# Patient Record
Sex: Female | Born: 1975 | Race: White | Hispanic: No | Smoking: Former smoker
Health system: Southern US, Community
[De-identification: ages and names within clinical notes are randomized; demographics above are authoritative.]

## PROBLEM LIST (undated history)

## (undated) DIAGNOSIS — J45909 Unspecified asthma, uncomplicated: Secondary | ICD-10-CM

## (undated) DIAGNOSIS — K589 Irritable bowel syndrome without diarrhea: Secondary | ICD-10-CM

## (undated) DIAGNOSIS — K219 Gastro-esophageal reflux disease without esophagitis: Secondary | ICD-10-CM

## (undated) DIAGNOSIS — M810 Age-related osteoporosis without current pathological fracture: Secondary | ICD-10-CM

## (undated) DIAGNOSIS — J449 Chronic obstructive pulmonary disease, unspecified: Secondary | ICD-10-CM

## (undated) DIAGNOSIS — Z148 Genetic carrier of other disease: Secondary | ICD-10-CM

## (undated) DIAGNOSIS — M419 Scoliosis, unspecified: Secondary | ICD-10-CM

## (undated) DIAGNOSIS — M1990 Unspecified osteoarthritis, unspecified site: Secondary | ICD-10-CM

## (undated) HISTORY — DX: Chronic obstructive pulmonary disease, unspecified: J44.9

## (undated) HISTORY — DX: Gastro-esophageal reflux disease without esophagitis: K21.9

## (undated) HISTORY — DX: Genetic carrier of other disease: Z14.8

## (undated) HISTORY — DX: Unspecified osteoarthritis, unspecified site: M19.90

## (undated) HISTORY — DX: Scoliosis, unspecified: M41.9

## (undated) HISTORY — DX: Unspecified asthma, uncomplicated: J45.909

## (undated) HISTORY — DX: Age-related osteoporosis without current pathological fracture: M81.0

## (undated) HISTORY — DX: Irritable bowel syndrome, unspecified: K58.9

## (undated) HISTORY — PX: OTHER SURGICAL HISTORY: SHX169

---

## 2006-01-30 ENCOUNTER — Ambulatory Visit: Payer: Self-pay | Admitting: Family Medicine

## 2006-11-07 ENCOUNTER — Ambulatory Visit (HOSPITAL_COMMUNITY): Payer: Self-pay | Admitting: Pulmonary Disease

## 2006-11-07 ENCOUNTER — Encounter (HOSPITAL_COMMUNITY): Admission: RE | Admit: 2006-11-07 | Discharge: 2006-12-07 | Payer: Self-pay | Admitting: Pulmonary Disease

## 2006-12-12 ENCOUNTER — Encounter (HOSPITAL_COMMUNITY): Admission: RE | Admit: 2006-12-12 | Discharge: 2007-01-11 | Payer: Self-pay | Admitting: Pulmonary Disease

## 2007-03-12 ENCOUNTER — Ambulatory Visit (HOSPITAL_COMMUNITY): Admission: RE | Admit: 2007-03-12 | Discharge: 2007-03-12 | Payer: Self-pay | Admitting: Pulmonary Disease

## 2007-10-09 ENCOUNTER — Ambulatory Visit (HOSPITAL_COMMUNITY): Admission: RE | Admit: 2007-10-09 | Discharge: 2007-10-09 | Payer: Self-pay | Admitting: Pulmonary Disease

## 2008-04-03 ENCOUNTER — Ambulatory Visit (HOSPITAL_COMMUNITY): Admission: RE | Admit: 2008-04-03 | Discharge: 2008-04-03 | Payer: Self-pay | Admitting: Pulmonary Disease

## 2008-04-28 ENCOUNTER — Ambulatory Visit (HOSPITAL_COMMUNITY): Admission: RE | Admit: 2008-04-28 | Discharge: 2008-04-28 | Payer: Self-pay | Admitting: Pulmonary Disease

## 2008-07-10 ENCOUNTER — Ambulatory Visit (HOSPITAL_COMMUNITY): Admission: RE | Admit: 2008-07-10 | Discharge: 2008-07-10 | Payer: Self-pay | Admitting: Pulmonary Disease

## 2008-10-16 ENCOUNTER — Ambulatory Visit (HOSPITAL_COMMUNITY): Admission: RE | Admit: 2008-10-16 | Discharge: 2008-10-16 | Payer: Self-pay | Admitting: Pulmonary Disease

## 2009-03-29 ENCOUNTER — Ambulatory Visit (HOSPITAL_COMMUNITY): Admission: RE | Admit: 2009-03-29 | Discharge: 2009-03-29 | Payer: Self-pay | Admitting: Pulmonary Disease

## 2009-08-09 ENCOUNTER — Ambulatory Visit (HOSPITAL_COMMUNITY): Admission: RE | Admit: 2009-08-09 | Discharge: 2009-08-09 | Payer: Self-pay | Admitting: Pulmonary Disease

## 2009-12-16 ENCOUNTER — Encounter: Payer: Self-pay | Admitting: Gastroenterology

## 2009-12-16 ENCOUNTER — Ambulatory Visit: Payer: Self-pay | Admitting: Internal Medicine

## 2009-12-16 DIAGNOSIS — R634 Abnormal weight loss: Secondary | ICD-10-CM

## 2009-12-16 DIAGNOSIS — D539 Nutritional anemia, unspecified: Secondary | ICD-10-CM | POA: Insufficient documentation

## 2009-12-16 DIAGNOSIS — E8801 Alpha-1-antitrypsin deficiency: Secondary | ICD-10-CM

## 2009-12-16 DIAGNOSIS — R197 Diarrhea, unspecified: Secondary | ICD-10-CM

## 2009-12-17 ENCOUNTER — Encounter: Payer: Self-pay | Admitting: Internal Medicine

## 2009-12-17 DIAGNOSIS — R945 Abnormal results of liver function studies: Secondary | ICD-10-CM | POA: Insufficient documentation

## 2009-12-17 LAB — CONVERTED CEMR LAB
Albumin: 4.8 g/dL (ref 3.5–5.2)
Alkaline Phosphatase: 144 units/L — ABNORMAL HIGH (ref 39–117)
Basophils Absolute: 0 10*3/uL (ref 0.0–0.1)
Basophils Relative: 0 % (ref 0–1)
Eosinophils Absolute: 0.1 10*3/uL (ref 0.0–0.7)
HCT: 35.7 % — ABNORMAL LOW (ref 36.0–46.0)
Indirect Bilirubin: 0.9 mg/dL (ref 0.0–0.9)
Lymphs Abs: 1.3 10*3/uL (ref 0.7–4.0)
MCV: 104.4 fL — ABNORMAL HIGH (ref 78.0–100.0)
RBC: 3.42 M/uL — ABNORMAL LOW (ref 3.87–5.11)
RDW: 12.5 % (ref 11.5–15.5)
Tissue Transglutaminase Ab, IgA: 0.8 units (ref <7)
Total Bilirubin: 1.1 mg/dL (ref 0.3–1.2)
WBC: 3.5 10*3/uL — ABNORMAL LOW (ref 4.0–10.5)

## 2009-12-18 ENCOUNTER — Encounter: Payer: Self-pay | Admitting: Gastroenterology

## 2009-12-20 ENCOUNTER — Telehealth: Payer: Self-pay | Admitting: Internal Medicine

## 2009-12-23 ENCOUNTER — Encounter: Payer: Self-pay | Admitting: Internal Medicine

## 2009-12-24 LAB — CONVERTED CEMR LAB
Ferritin: 293 ng/mL — ABNORMAL HIGH (ref 10–291)
HCV Ab: NEGATIVE
Hepatitis B Surface Ag: NEGATIVE
Iron: 142 ug/dL (ref 42–145)
TIBC: 353 ug/dL (ref 250–470)
UIBC: 211 ug/dL

## 2009-12-27 ENCOUNTER — Ambulatory Visit: Payer: Self-pay | Admitting: Internal Medicine

## 2009-12-27 ENCOUNTER — Ambulatory Visit (HOSPITAL_COMMUNITY): Admission: RE | Admit: 2009-12-27 | Discharge: 2009-12-27 | Payer: Self-pay | Admitting: Internal Medicine

## 2010-01-03 ENCOUNTER — Encounter (INDEPENDENT_AMBULATORY_CARE_PROVIDER_SITE_OTHER): Payer: Self-pay

## 2010-01-27 ENCOUNTER — Encounter (INDEPENDENT_AMBULATORY_CARE_PROVIDER_SITE_OTHER): Payer: Self-pay | Admitting: *Deleted

## 2010-01-27 ENCOUNTER — Ambulatory Visit (HOSPITAL_COMMUNITY): Admission: RE | Admit: 2010-01-27 | Discharge: 2010-01-27 | Payer: Self-pay | Admitting: Pulmonary Disease

## 2010-02-28 ENCOUNTER — Encounter (INDEPENDENT_AMBULATORY_CARE_PROVIDER_SITE_OTHER): Payer: Self-pay

## 2010-03-05 ENCOUNTER — Encounter: Payer: Self-pay | Admitting: Gastroenterology

## 2010-03-09 ENCOUNTER — Encounter: Payer: Self-pay | Admitting: Gastroenterology

## 2010-03-09 LAB — CONVERTED CEMR LAB
ALT: 18 units/L (ref 0–35)
Albumin: 4.7 g/dL (ref 3.5–5.2)
Bilirubin, Direct: 0.1 mg/dL (ref 0.0–0.3)

## 2010-04-26 ENCOUNTER — Telehealth (INDEPENDENT_AMBULATORY_CARE_PROVIDER_SITE_OTHER): Payer: Self-pay

## 2010-04-29 ENCOUNTER — Encounter (INDEPENDENT_AMBULATORY_CARE_PROVIDER_SITE_OTHER): Payer: Self-pay

## 2010-05-11 ENCOUNTER — Telehealth: Payer: Self-pay | Admitting: Gastroenterology

## 2010-06-28 ENCOUNTER — Encounter (INDEPENDENT_AMBULATORY_CARE_PROVIDER_SITE_OTHER): Payer: Self-pay | Admitting: *Deleted

## 2010-06-28 LAB — CONVERTED CEMR LAB
AST: 13 units/L (ref 0–37)
Albumin: 4.4 g/dL (ref 3.5–5.2)
Alkaline Phosphatase: 100 units/L (ref 39–117)
Bilirubin, Direct: 0.1 mg/dL (ref 0.0–0.3)

## 2010-07-12 ENCOUNTER — Ambulatory Visit: Payer: Self-pay | Admitting: Internal Medicine

## 2010-07-13 ENCOUNTER — Encounter: Payer: Self-pay | Admitting: Internal Medicine

## 2010-07-15 ENCOUNTER — Encounter (INDEPENDENT_AMBULATORY_CARE_PROVIDER_SITE_OTHER): Payer: Self-pay

## 2010-07-15 LAB — CONVERTED CEMR LAB
ALT: 24 units/L (ref 0–35)
AST: 19 units/L (ref 0–37)
Albumin: 4.3 g/dL (ref 3.5–5.2)
Alkaline Phosphatase: 125 units/L — ABNORMAL HIGH (ref 39–117)
Total Protein: 6.6 g/dL (ref 6.0–8.3)

## 2010-07-27 ENCOUNTER — Ambulatory Visit (HOSPITAL_COMMUNITY): Admission: RE | Admit: 2010-07-27 | Discharge: 2010-07-27 | Payer: Self-pay | Admitting: Pulmonary Disease

## 2010-08-09 ENCOUNTER — Ambulatory Visit (HOSPITAL_COMMUNITY): Admission: RE | Admit: 2010-08-09 | Discharge: 2010-08-09 | Payer: Self-pay | Admitting: Urology

## 2010-08-25 ENCOUNTER — Ambulatory Visit (HOSPITAL_COMMUNITY): Admission: RE | Admit: 2010-08-25 | Discharge: 2010-08-25 | Payer: Self-pay | Admitting: Neurology

## 2010-08-31 ENCOUNTER — Encounter: Payer: Self-pay | Admitting: Internal Medicine

## 2010-09-01 ENCOUNTER — Encounter: Payer: Self-pay | Admitting: Internal Medicine

## 2011-01-01 ENCOUNTER — Encounter: Payer: Self-pay | Admitting: Pulmonary Disease

## 2011-01-10 NOTE — Assessment & Plan Note (Signed)
Summary: FU WITH RMR ONLY/DIARRHEA/SS   Visit Type:  Follow-up Visit Primary Care Provider:  Juanetta Gosling  Chief Complaint:  F/U diarrhea.  History of Present Illness:   Noncompliant 35 year old lady with weight loss and persistent diarrhea. We saw this lady thwe first part of the year-  ileocolonoscopy and stool studies failed to demonstrate a cause of her diarrhea. She has  alpha one antitrypsin deficiency. Not felt to be causding much trouble with her liver;   LFT's normal. Hospitalized one month ago with a UTI and a slightly elevated lipase. She tells me antispasmodics and including hyoscyamine and antidiarrheals such as Imodium have not helped her diarrhea; agree superior . She is lost 2 pounds and she was last seen. Of note, she has missed 2 appointments with [place] since January. She report being hospitalized one month ago with a urinary tract infection as stated above her weight is 111.5 pounds  She denies melena rectal bleeding     Current Medications (verified): 1)  Actonel 150 Mg Tabs (Risedronate Sodium) .... Once Monthly 2)  Hydrocodone-Acetaminophen 10-325 Mg Tabs (Hydrocodone-Acetaminophen) .... Take 1 Tablet By Mouth Four Times A Day 3)  Promethazine Hcl 25 Mg Tabs (Promethazine Hcl) .... As Needed 4)  Alprazolam 1 Mg Tabs (Alprazolam) .... Take 1 Tablet By Mouth Four Times A Day 5)  Zemaira 1000 Mg Solr (Proteinase Inhibitor (Human)) .... Infusion Q Weekly 6)  Vit D Qd 7)  Depo-Provera .... Q 3 Months  Allergies (verified): 1)  ! 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  No known liver dz. Scoliosis Osteoporosis Anxiety Disorder Arthritis COPD  Past Surgical History: Last updated: 12/16/2009 None  Family History: Last updated: 12/16/2009 No chronic GI illness, liver dz, CRC.  No FH celiac, IBD.  Social History: Last updated: 12/16/2009 Going through divorced.  Remote smoker,  smoked 8 years.  No alcohol, no drugs. 2 children, age 85 and 4.  Disabled.  Vital Signs:  Patient profile:   35 year old female Height:      61 inches Weight:      111.50 pounds BMI:     21.14 Temp:     97.6 degrees F oral Pulse rate:   84 / minute BP sitting:   86 / 60  (left arm) Cuff size:   regular  Vitals Entered By: Cloria Spring LPN (July 12, 2010 3:57 PM)  Physical Exam  General:  alert conversant lady. She is currently by her daughter. Eyes:  no scleral icterus. Lungs:  clear to auscultation Heart:  regular rate rhythm without murmur gallop rub Abdomen:  flat positive bowel sounds entirely soft and nontender without appreciable mass or megaly  Impression & Recommendations: Impression: Apparently nearly incessant diarrhea. She occasionally has some form to ther her stool. She certainly may have irritable bowel syndrome but I'd be  concerned about some other superimposed process. She has been screened already for inflammatory/infectious etiology. No evidence of celiac disease. I wonder if alpha-1 antitrypsin deficiency isn't  playing via a mechanism of pancreatic exocrine insufficiency. Her minimally elevated serum lipase recently is nonspecific.  Recommendations: Trial of pancreatic enzymes Creon 20 2 capsules with meals; one with snacks- one-month supply dispensed.  Dexilant 60 orally daily while taking pancreatic enzyme supplements  Bentyl  10 mg a.c. and h.s. p.r.n. diarrhea #40 with one refill   Patient let us know how she is doing with diarrhea in one month and  we will go from there.  Other Orders: T-Hepatic Function [id number]) T-Amylase [phone]) T-Lipase [id number])  Appended Document: Orders Update    Clinical Lists Changes  Orders: Added new Service order of Est. Patient Level IV (62952) - Signed
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
## 2011-01-10 NOTE — Letter (Signed)
Summary: LABS  LABS   Imported By: Rexene Alberts 09/01/2010 09:26:31  _____________________________________________________________________  External Attachment:    Type:   Image     Comment:   External Document

## 2011-01-10 NOTE — Progress Notes (Signed)
Summary: twinrix  ---- Converted from flag ---- ---- 05/10/2010 4:04 PM, Cloria Spring LPN wrote: Pt said she was told that she needs some Hepatitis shots. If so,  needs order to take to PCP. ------------------------------  Please give patient copy of this:  Recommend vaccinations for Hep A and B for h/o alpha-1-antitrypsin deficiency Twinrix One mL IM X 3 at 0, 1, and 6 months.  Appended Document: twinrix Printed the above. LMOM that i was placing this in the mail.

## 2011-01-10 NOTE — Letter (Signed)
Summary: ER NOTES FROM [facility] MEDICAL  ER NOTES FROM [facility] MEDICAL   Imported By: Rexene Alberts 08/31/2010 15:26:22  _____________________________________________________________________  External Attachment:    Type:   Image     Comment:   External Document

## 2011-01-10 NOTE — Progress Notes (Signed)
Summary: blood when had diarrhea/ lomotil not working now  Phone Note Call from Patient   Caller: Patient Summary of Call: Pt c/o of Lomotil not helping diarrhea now. [place] Spaniel it used to work good. Also concerned, that she had some blood loss Sat. night two times.  York Spaniel it was quiet a bit of blood when she had a stool the first time, and the second time it was not as much. She had some leakage of blood afterwards and had to keep folded bathroom tissue in her under pants. She wants to know if there is anything she needs to do for now. Please advise.  Initial call taken by: Cloria Spring LPN,  December 20, 2009 2:49 PM     Appended Document: blood when had diarrhea/ lomotil not working now [employer] to check on pt. LMOM to call.  Appended Document: blood when had diarrhea/ lomotil not working now Pt scheduled for TCS today.  Appended Document: blood when had diarrhea/ lomotil not working now [employer] to pt. She said her appt for TCS is for 12/27/2009. Said she is still having diarrhea alot, the blood is no longer present. The Lomotil is not helping. She wants to know if there is anything  else she can use til after her TCS on Monday.  Appended Document: blood when had diarrhea/ lomotil not working now Try the [employer]; no further meds for diarrhea  Appended Document: blood when had diarrhea/ lomotil not working now Pt informed.

## 2011-01-10 NOTE — Progress Notes (Signed)
Summary: PT C/O DIARRHEA  Phone Note Call from Patient   Caller: Patient Summary of Call: Pt called to say she is having more diarrhea. Usually just twice during the day and 3-4 times during the night. Lomotil helped some but Cholestyramine she felt did not help at all. She said the diarrhea never completely stopped, just kind of tapered off for awhile.  She would like to know what she should do.  (ALSO:  SAID STEP FATHER JUST DIAGNOSED WITH HEPATITIS C). Initial call taken by: Cloria Spring LPN,  Apr 26, 2010 10:08 AM     Appended Document: PT C/O DIARRHEA appt w extender (missed her 3/15 appt0  Appended Document: PT C/O DIARRHEA LMOM needs appt and Darl Pikes will call.  Appended Document: PT C/O DIARRHEA pt aware of appt for 6-10 at 1030 w/LSL

## 2011-01-10 NOTE — Miscellaneous (Signed)
Summary: stool studies from aph  Clinical Lists Changes Culture, Stool(Salm/Shig/Campy&ECO157) - STATUS: Final  SEE NOTE.                                 Perform Date: 17Jan11 11:46  Ordered By: Jena Gauss MD , Gerrit Friends           Ordered Date: 17Jan11 11:47  Facility: [facility]                               Department: MICR  Service Report Text     SPECIMEN OBTAINED:            12/27/2009 11:46  SPECIMEN DESCRIPTION:         STOOL                                ENDO SPECIMEN  SPECIAL REQUESTS:             NONE  CULTURE:                      NO SALMONELLA, SHIGELLA, CAMPYLOBACTER, OR                                YERSINIA ISOLATED                                Performed at [facility] [address]  REPORT STATUS:                FINAL                                12/31/2009  Additional Information  HL7 RESULT STATUS : F  External IF Update Timestamp : 2009-12-31:11:01:00.000000  Ova and Parasite Exam - STATUS: Final  SEE NOTE.                                 Perform Date: 17Jan11 11:47  Ordered By: Jena Gauss MD , Gerrit Friends           Ordered Date: 17Jan11 11:47  Facility: [facility]                               Department: MICR  Service Report Text     SPECIMEN OBTAINED:            12/27/2009 11:47  SPECIMEN DESCRIPTION:         STOOL                                ENDO SPECIMEN  SPECIAL REQUESTS:             NONE  OVA AND PARASITES:            NO OVA OR PARASITES SEEN  REPORT STATUS:                FINAL  12/28/2009  Additional Information  HL7 RESULT STATUS : F  External IF Update Timestamp : 2009-12-28:16:22:00.000000    Fecal-Lactoferrin - STATUS: Final  SEE NOTE.                                 Perform Date: 17Jan11 11:47  Ordered By: Jena Gauss MD , Gerrit Friends           Ordered Date: 17Jan11 11:47  Facility: [facility]                               Department: MICR  Service Report Text     SPECIMEN OBTAINED:            12/27/2009 11:47  SPECIMEN DESCRIPTION:          STOOL                                ENDO SPECIMEN  SPECIAL REQUESTS:             NONE  FECAL LACTOFERRIN:            NEGATIVE  REPORT STATUS:                FINAL                                12/28/2009  Additional Information  HL7 RESULT STATUS : F  External IF Update Timestamp : 2009-12-28:07:00:00.000000      Clostridium Difficile Tox A/B (EIA)-Fecl - STATUS: Final  SEE NOTE.                                 Perform Date: 17Jan11 11:46  Ordered By: Jena Gauss MD , Gerrit Friends           Ordered Date: 17Jan11 11:46  Facility: [facility]                               Department: MICR  Service Report Text     SPECIMEN OBTAINED:            12/27/2009 11:46  SPECIMEN DESCRIPTION:         STOOL                                ENDO SPECIMEN  SPECIAL REQUESTS:             NONE  C DIFF TOXIN A and B:         NEGATIVE  REPORT STATUS:                FINAL                                12/28/2009  Additional Information  HL7 RESULT STATUS : F  External IF Update Timestamp : 2009-12-28:05:40:00.000000

## 2011-01-10 NOTE — Letter (Signed)
Summary: Recall, Labs Needed  [facility]  [address]   [place], [place] [address]   Phone: [phone]  Fax: [phone]    Apr 29, 2010  [facility] [address] [place], [place]  [address] 11-14-1976   Dear Ms. Woodham,   Our records indicate it is time to repeat your blood work.  You can take the enclosed form to the lab on or near the date indicated.  Please make note of the new location of the lab:   [address], 2nd floor   [facility]  Our office will call you within a week to ten business days with the results.  If you do not hear from [place] in 10 business days, you should call the office.  If you have any questions regarding this, call the office at [phone], and ask for the nurse.  Labs are due on 06/08/2010.   Sincerely,    Hendricks Limes LPN  [facility] Ph: [phone]   Fax: [phone]

## 2011-01-10 NOTE — Letter (Signed)
Summary: TCS ORDER  TCS ORDER   Imported By: Ave Filter 12/23/2009 17:46:18  _____________________________________________________________________  External Attachment:    Type:   Image     Comment:   External Document

## 2011-01-10 NOTE — Miscellaneous (Signed)
Summary: Orders Update  Clinical Lists Changes  Orders: Added new Test order of T-Hepatic Function ([id number]) - Signed 

## 2011-01-10 NOTE — Assessment & Plan Note (Signed)
Summary: elevated LFTs,anemia/ss   Visit Type:  Initial Consult Referring Provider:  Juanetta Gosling Primary Care Provider:  Juanetta Gosling  Chief Complaint:  mildly anemic and diarrhea.  History of Present Illness: Miss Margaret Stewart is a pleasant 35 year old Caucasian female with history of alpha-1 antitrypsin deficiency who presents for further evaluation of chronic diarrhea and mild anemia. She's had intermittent diarrhea throughout the years but worse since 9/10.  She notes this is also the time that she separated from her husband and she's been very stressful social situation since then. Workup thus far has included a CBC which showed a white count of 2800, hemoglobin 11.8, hematocrit 36, MCV 110.1. B12 of 442, folate 10.8, stool for C. difficile was negative. In the past she has tried Bentyl and Levsin without relief. Currently she takes Lomotil to control her diarrhea. If she does not take Lomotil she has too numerous stools to count. She complains of nocturnal diarrhea times. Stools are worse postprandially. She does see bright red blood if she's had a hard stool related to the Lomotil. Denies any black stools. She lost down to approximately 80 pounds after separation from her husband. She is back up to 113 pounds.  Denies n/v.    She it was initially alpha-1 antitrypsin deficiency at age 29.  She started [place] four years ago.  She is followed by Dr. Shaune Pollack.  She was diagnosed after multiple bouts of PNA/bronchitis.  Her children have been tested as negative.  Her brother has similar symptoms but will not get evaluated.  She reports having abnormal lfts in the past, but not recently.    She has had remote EGD in [place], [place].  No prior TCS. To        Current Medications (verified): 1)  Lomotil 2.5-0.025 Mg Tabs (Diphenoxylate-Atropine) .... As Needed 2)  Actonel 150 Mg Tabs (Risedronate Sodium) .... Once Monthly 3)  Hydrocodone-Acetaminophen 10-325 Mg Tabs (Hydrocodone-Acetaminophen) .... Take 1  Tablet By Mouth Four Times A Day 4)  Promethazine Hcl 25 Mg Tabs (Promethazine Hcl) .... As Needed 5)  Alprazolam 1 Mg Tabs (Alprazolam) .... Take 1 Tablet By Mouth Four Times A Day 6)  Zemaira 1000 Mg Solr (Proteinase Inhibitor (Human)) .... Infusion Q Weekly 7)  Temazepam .... One At Qhs 8)  Vit D Qd 9)  Iron One Daily 10)  Depo-Provera .... Q 3 Months  Allergies (verified): No Known Drug Allergies  Past History:  Past Medical History: AAT deficiency, dx age 50, predominantly lung dz.  No known liver dz. Scoliosis Osteoporosis Anxiety Disorder Arthritis COPD  Past Surgical History: None  Family History: No chronic GI illness, liver dz, CRC.  No FH celiac, IBD.  Social History: Going through divorced.  Remote smoker, smoked 8 years.  No alcohol, no drugs. 2 children, age 17 and 7.  Disabled.  Review of Systems General:  Complains of weight loss; denies fever, chills, sweats, anorexia, fatigue, and weakness. Eyes:  Denies vision loss. ENT:  Denies nasal congestion and difficulty swallowing. CV:  Denies chest pains, angina, palpitations, dyspnea on exertion, and peripheral edema. Resp:  she has sob and wheezing intermittently. GI:  See HPI. GU:  Denies urinary burning and blood in urine. MS:  Complains of joint pain / LOM; scoliosis, not candidate for surgery secondary to AAT def. Derm:  Denies rash and itching. Neuro:  Denies weakness, memory loss, and confusion. Psych:  Complains of anxiety; denies depression. Endo:  Complains of unusual weight change. Heme:  Denies bruising and bleeding.  Allergy:  Denies hives and rash.  Vital Signs:  Patient profile:   35 year old female Height:      61 inches Weight:      113 pounds BMI:     21.43 Temp:     97.9 degrees F oral Pulse rate:   72 / minute BP sitting:   120 / 90  (left arm) Cuff size:   regular  Vitals Entered By: Cloria Spring LPN (December 16, 2009 10:56 AM)  Physical Exam  General:  Well developed, well  nourished, no acute distress. Head:  Normocephalic and atraumatic. Eyes:  Conjunctivae pink, no scleral icterus.  Mouth:  Oropharyngeal mucosa moist, pink.  No lesions, erythema or exudate.   poor dentition.   Neck:  Supple; no masses or thyromegaly. Lungs:  Clear throughout to auscultation. Heart:  Regular rate and rhythm; no murmurs, rubs,  or bruits. Abdomen:  Bowel sounds normal.  Abdomen is soft, nontender, nondistended.  No rebound or guarding.  No hepatosplenomegaly, masses or hernias.  No abdominal bruits.  Extremities:  No clubbing, cyanosis, edema or deformities noted. Neurologic:  Alert and  oriented x4;  grossly normal neurologically. Skin:  Intact without significant lesions or rashes. Cervical Nodes:  No significant cervical adenopathy. Psych:  Alert and cooperative. Normal mood and affect.  Impression & Recommendations:  Problem # 1:  DIARRHEA, CHRONIC (ICD-787.91) Intermittent for years but worse the last 4-6 months.  Alarm symptoms include nocturnal diarrhea.  She has AAT deficiency which has been associated with celiac disease, therefore will screen.  Check thyroid function. Will likely need colonscopy for further evaluation.    Problem # 2:  MACROCYTIC ANEMIA (ICD-281.9) Very mild anemia, macrocytic.  Normal B12/folate levels.  No menstruation on Depot-Provera. Labs. Likely TCS.  Problem # 3:  WEIGHT LOSS, ABNORMAL (ICD-783.21) Resolved.  Likely secondary to emotional stress.  Will check thyroid function.  Problem # 4:  ALPHA-1-ANTITRYPSIN DEFICIENCY (ICD-273.4) Predominantly lung involvement.  H/O abnormal LFTs in past, but no further details.  Will recheck lfts.  May need abd u/s.  Recommend Hep A and Hep B vaccinations if not done previously, especially if any liver disease discovered.  Other Orders: T-CBC w/Diff [id number]) T-igA ([id number]) T-Tissue Transglutamase Ab IgA ([id number]) T-TSH [phone]) T-Hepatic Function [id number]) Consultation Level III  (24401)    I would like to thank Dr. Shaune Pollack for allowing [place] to take part in the care of this nice patient.  Appended Document: elevated LFTs,anemia/ss celiac labs negative.  Proceed with TCS with RMR.  Hold iron for 7 days. See other labs orders (sent previously).  Appended Document: elevated LFTs,anemia/ss pt aware

## 2011-01-10 NOTE — Letter (Signed)
Summary: LABS/DR HAWKINS  LABS/DR HAWKINS   Imported By: Diana Eves 12/17/2009 10:59:38  _____________________________________________________________________  External Attachment:    Type:   Image     Comment:   External Document

## 2011-01-10 NOTE — Letter (Signed)
Summary: REFERRAL DR Barth Kirks  REFERRAL DR Ramon Dredge HAWKING   Imported By: Rexene Alberts 07/13/2010 12:14:42  _____________________________________________________________________  External Attachment:    Type:   Image     Comment:   External Document

## 2011-01-10 NOTE — Letter (Signed)
Summary: Recall, Labs Needed  [facility]  [address]   [place], [place] [address]   Phone: [phone]  Fax: [phone]    February 28, 2010  [facility] [address] [place], [place]  [address] 14-Jun-1976   Dear Ms. Neidhardt,   Our records indicate it is time to repeat your blood work.  You can take the enclosed form to the lab on or near the date indicated.  Please make note of the new location of the lab:   [address], 2nd floor   [facility]  Our office will call you within a week to ten business days with the results.  If you do not hear from [place] in 10 business days, you should call the office.  If you have any questions regarding this, call the office at [phone], and ask for the nurse.  Labs are due on 03/18/2010. Bloodwork is fasting. Please do not eat or drink anything after midnight.  Sincerely,    Hendricks Limes LPN  [facility] Ph: [phone]   Fax: [phone]

## 2011-01-10 NOTE — Letter (Signed)
Summary: Normal Results Letter  [facility]  [address]   [place], [place] [address]   Phone: [phone]  Fax: [phone]    July 15, 2010  [facility] [address] [place], [place]  [address] 10/18/1976   Dear Ms. Kuc,    We just wanted to inform you that all of your tests were normal.  If you have any questions, please call the office at [phone].   Thank you,    Hendricks Limes, LPN Cloria Spring, LPN  [facility] Ph: [phone]   Fax: [phone]

## 2011-01-10 NOTE — Letter (Signed)
Summary: Scheduled Appointment  [facility]  [address]   [place], [place] [address]   Phone: [phone]  Fax: [phone]    June 28, 2010   Dear: Ernest Haber [facility]            DOB: [birth year]    I have been instructed to schedule you an appointment in our office.  Your appointment is as follows:   Date:           July 12, 2010   Time:          4PM     Please be here 15 minutes early.   Provider:      DR Jena Gauss    Please contact the office if you need to reschedule this appointment for a more convenient time.   Thank you,    Diana Eves       [facility] Ph: [phone]   Fax: [phone]

## 2011-01-10 NOTE — Letter (Signed)
Summary: REFERRAL DR Juanetta Gosling  REFERRAL DR HAWKINS   Imported By: Diana Eves 12/17/2009 10:54:43  _____________________________________________________________________  External Attachment:    Type:   Image     Comment:   External Document

## 2011-02-26 LAB — OVA AND PARASITE EXAMINATION: Ova and parasites: NONE SEEN

## 2011-02-26 LAB — FECAL LACTOFERRIN, QUANT: Fecal Lactoferrin: NEGATIVE

## 2011-02-26 LAB — CLOSTRIDIUM DIFFICILE EIA: C difficile Toxins A+B, EIA: NEGATIVE

## 2011-02-26 LAB — STOOL CULTURE

## 2011-03-18 LAB — BLOOD GAS, ARTERIAL
Bicarbonate: 20.9 mEq/L (ref 20.0–24.0)
O2 Saturation: 98.1 %

## 2011-04-25 NOTE — Procedures (Signed)
NAMEGURTHA, PICKER             ACCOUNT NO.:  [id number]   MEDICAL RECORD NO.:  [id number]          PATIENT TYPE:  OUT   LOCATION:  RESP                          FACILITY:  [facility]   PHYSICIAN:  Edward L. Juanetta Gosling, M.D.DATE OF BIRTH:  [birth year]   DATE OF PROCEDURE:  DATE OF DISCHARGE:  08/09/2009                            PULMONARY FUNCTION TEST   PULMONARY FUNCTION TEST:  1. Spirometry shows a moderate ventilatory defect with airflow      obstruction.  2. Lung volumes are normal.  3. DLCO is mildly reduced.  4. Arterial blood gases normal.  5. There is no significant bronchodilator improvement.  When compared      to the study of October 16, 2008, there has been improvement in      both forced vital capacity and FEV-1.      Edward L. Juanetta Gosling, M.D.  Electronically Signed     ELH/MEDQ  D:  08/09/2009  T:  08/10/2009  Job:  [id number]

## 2011-04-25 NOTE — Procedures (Signed)
NAME:  Margaret Stewart, Margaret Stewart             ACCOUNT NO.:  [id number]   MEDICAL RECORD NO.:  [id number]          PATIENT TYPE:  OUT   LOCATION:  RESP                          FACILITY:  [facility]   PHYSICIAN:  Edward L. Juanetta Gosling, M.D.DATE OF BIRTH:  11-01-1976   DATE OF PROCEDURE:  04/04/2008  DATE OF DISCHARGE:                            PULMONARY FUNCTION TEST   1. Spirometry shows a moderate ventilatory defect with evidence of      airflow obstruction.  2. Lung volumes are normal.  3. DLCO is mildly reduced.  4. Arterial blood gases are normal.  5. There is no significant bronchodilator response.  6. Compared to study of October 09, 2007, there has been little      change.      Edward L. Juanetta Gosling, M.D.  Electronically Signed     ELH/MEDQ  D:  04/04/2008  T:  04/04/2008  Job:  [id number]

## 2011-04-28 NOTE — Procedures (Signed)
Margaret Stewart, Margaret Stewart             ACCOUNT NO.:  [id number]   MEDICAL RECORD NO.:  [id number]          PATIENT TYPE:  OUT   LOCATION:  RESP                          FACILITY:  [facility]   PHYSICIAN:  Edward L. Juanetta Gosling, M.D.DATE OF BIRTH:  09-09-1976   DATE OF PROCEDURE:  DATE OF DISCHARGE:                            PULMONARY FUNCTION TEST   Spirometry shows:  1. Moderate to severe ventilatory defect with evidence of airflow      obstruction.  2. Lung volumes are borderline reduced.  3. DLCO is moderately reduced.  4. Arterial blood gases are normal.  5. There is some improvement with inhaled bronchodilator.  6. Compared to study of April 03, 2008, there have been significant      deterioration in pulmonary function.      Edward L. Juanetta Gosling, M.D.  Electronically Signed     ELH/MEDQ  D:  10/17/2008  T:  10/17/2008  Job:  [id number]

## 2011-04-28 NOTE — Procedures (Signed)
NAMEMAHATI, VAJDA             ACCOUNT NO.:  [id number]   MEDICAL RECORD NO.:  [id number]          PATIENT TYPE:  REC   LOCATION:                                FACILITY:  [facility]   PHYSICIAN:  Edward L. Juanetta Gosling, M.D.DATE OF BIRTH:  June 25, 1976   DATE OF PROCEDURE:  DATE OF DISCHARGE:                            PULMONARY FUNCTION TEST   FINDINGS:  1. Spirometry shows a moderate ventilatory defect with evidence of      airflow obstruction.  2. Lung volumes are normal.  3. DLCO is mildly reduced.  4. Arterial blood gases are normal.  5. There is no significant bronchodilator effect.      Edward L. Juanetta Gosling, M.D.  Electronically Signed     ELH/MEDQ  D:  11/11/2006  T:  11/12/2006  Job:  [id number]

## 2011-04-28 NOTE — Procedures (Signed)
Margaret Stewart, Margaret Stewart             ACCOUNT NO.:  [id number]   MEDICAL RECORD NO.:  [id number]          PATIENT TYPE:  OUT   LOCATION:  RESP                          FACILITY:  [facility]   PHYSICIAN:  Edward L. Juanetta Gosling, M.D.DATE OF BIRTH:  [birth year]   DATE OF PROCEDURE:  DATE OF DISCHARGE:                            PULMONARY FUNCTION TEST   FINDINGS:  1. Spirometry shows a moderate ventilatory defect with evidence of      airflow obstruction.  2. Lung volumes are normal.  3. DLCO was mildly reduced.  4. Arterial blood gases are normal.  5. There was no significant bronchodilator improvement.      Edward L. Juanetta Gosling, M.D.  Electronically Signed     ELH/MEDQ  D:  10/10/2007  T:  10/10/2007  Job:  [id number]

## 2011-04-28 NOTE — Procedures (Signed)
NAMETZIPPY, TESTERMAN             ACCOUNT NO.:  [id number]   MEDICAL RECORD NO.:  [id number]          PATIENT TYPE:  OUT   LOCATION:  RESP                          FACILITY:  [facility]   PHYSICIAN:  Edward L. Juanetta Gosling, M.D.DATE OF BIRTH:  Jun 02, 1976   DATE OF PROCEDURE:  DATE OF DISCHARGE:                            PULMONARY FUNCTION TEST   BODY:  1. Spirometry shows a moderate ventilatory defect with evidence of      airflow obstruction.  2. Lung volumes are normal.  3. DLCO is mildly reduced.  4. Arterial blood gases are normal.  5. There is improvement with inhaled bronchodilator, but it has not      reached the level of significance.  There is no significant change      from the study of November 09, 2006.      Edward L. Juanetta Gosling, M.D.  Electronically Signed     ELH/MEDQ  D:  03/12/2007  T:  03/13/2007  Job:  [id number]

## 2011-09-05 LAB — BLOOD GAS, ARTERIAL
pCO2 arterial: 33.6 — ABNORMAL LOW
pH, Arterial: 7.401 — ABNORMAL HIGH

## 2011-09-12 LAB — BLOOD GAS, ARTERIAL
Bicarbonate: 22.5
FIO2: 0.21
O2 Saturation: 97.2
Patient temperature: 37
pO2, Arterial: 95.7

## 2011-09-20 LAB — BLOOD GAS, ARTERIAL
Bicarbonate: 19.4 — ABNORMAL LOW
pO2, Arterial: 111 — ABNORMAL HIGH

## 2012-01-15 IMAGING — CT CT ABD-PELV W/O CM
3 of 4 series · 8 of 46 positions shown, 15 images · non-contrast
Comparison: CT 03/29/2009

CLINICAL DATA: Recurrent urinary tract infection, bilateral renal
calculi

CT ABDOMEN AND PELVIS WITHOUT CONTRAST
TECHNIQUE: Multidetector CT imaging of the abdomen and pelvis was
performed following the standard protocol without intravenous
contrast.

[Series 3: lung 5.0 b60f · axial · 0.59mm/px · z∈[-105,-50]mm · 4 of 19 slices shown, 9 images]
[im 4/19  soft-tissue]
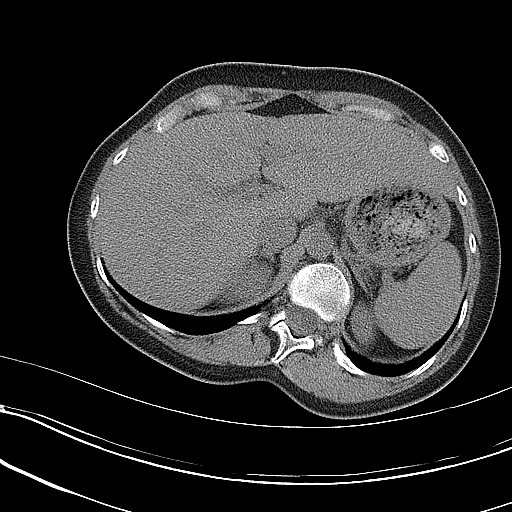
[im 4/19  lung]
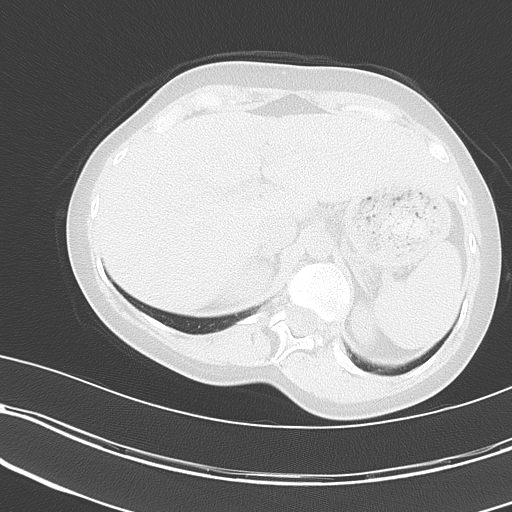
[im 4/19  bone]
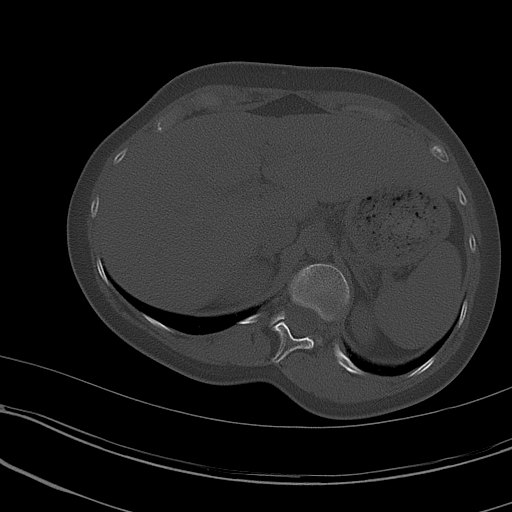
[im 8/19  soft-tissue]
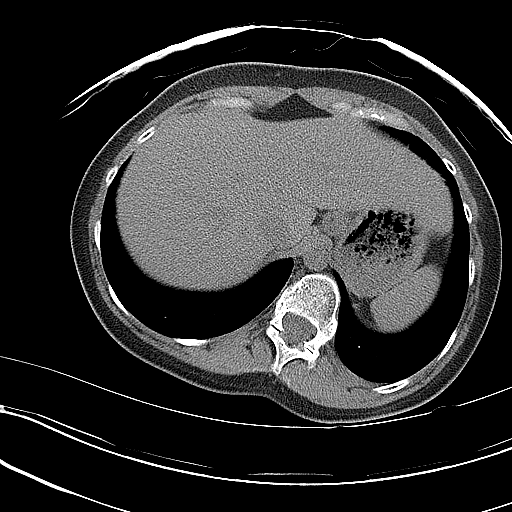
[im 8/19  lung]
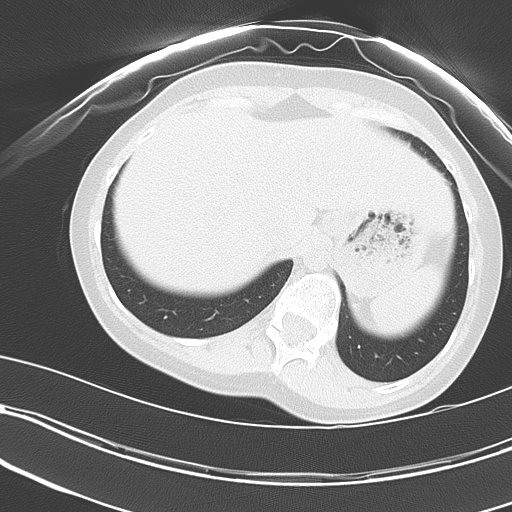
[im 11/19  soft-tissue]
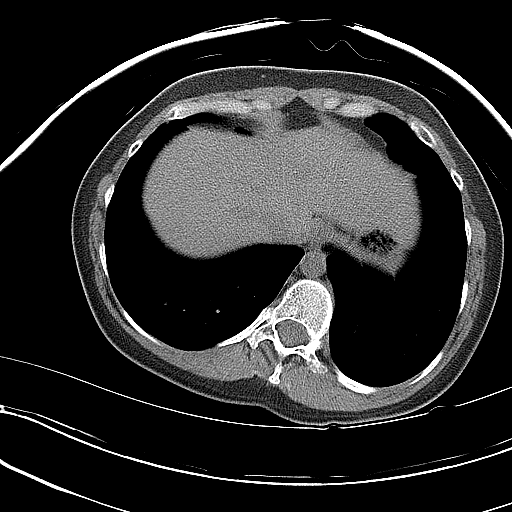
[im 11/19  lung]
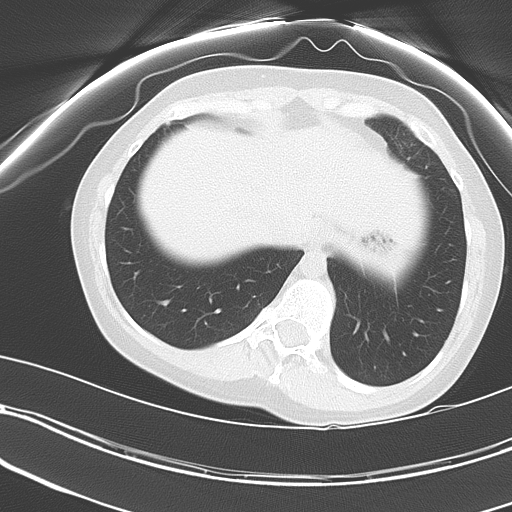
[im 15/19  soft-tissue]
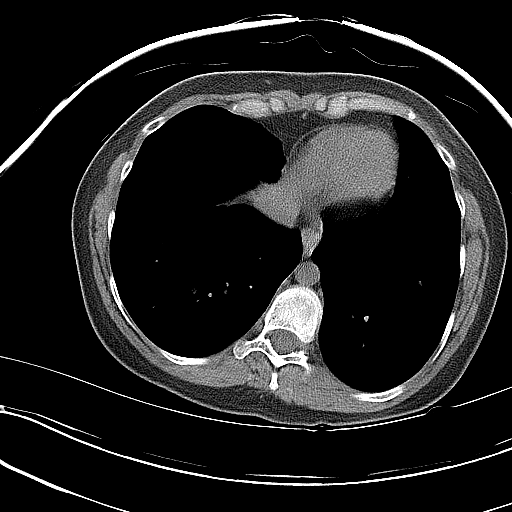
[im 15/19  lung]
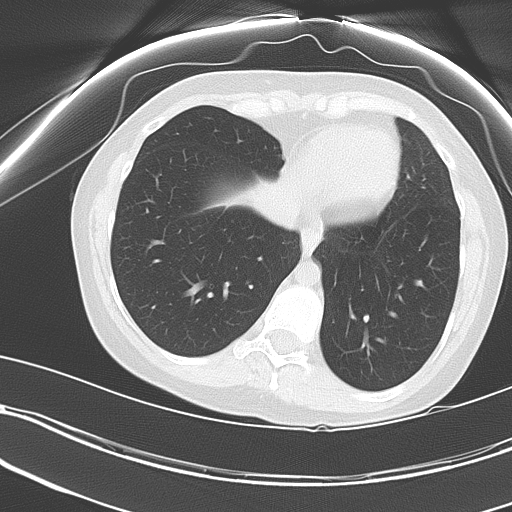

[Series 4: mpr coronal (id) · coronal · 0.57mm/px · 3 of 69 slices shown, 4 images]
[im 23/69  soft-tissue]
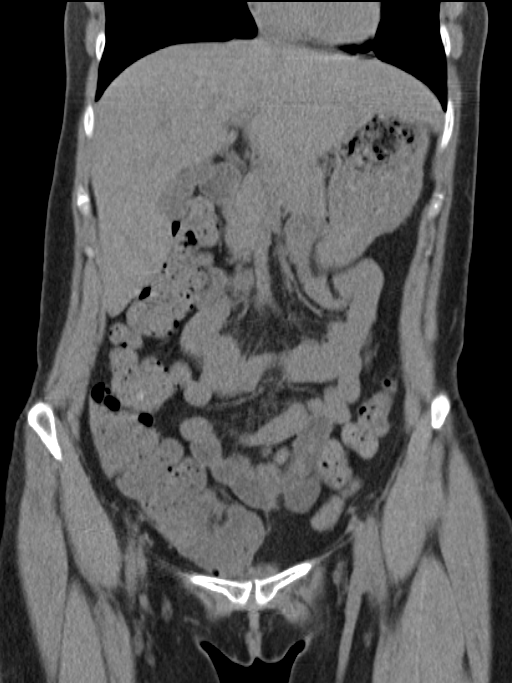
[im 31/69  soft-tissue]
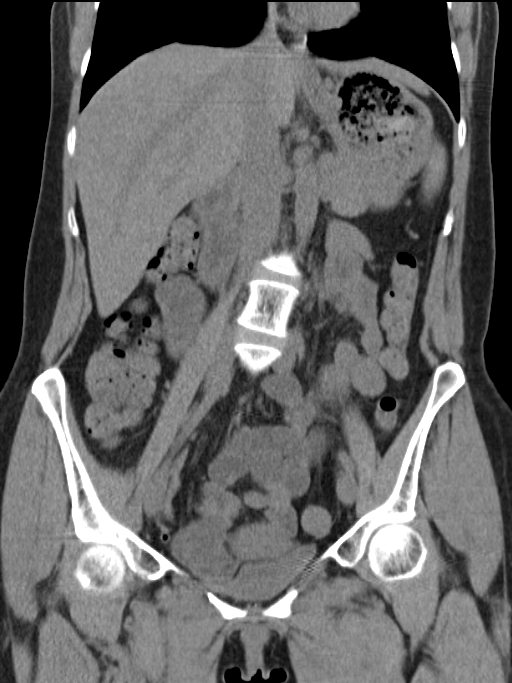
[im 31/69  bone]
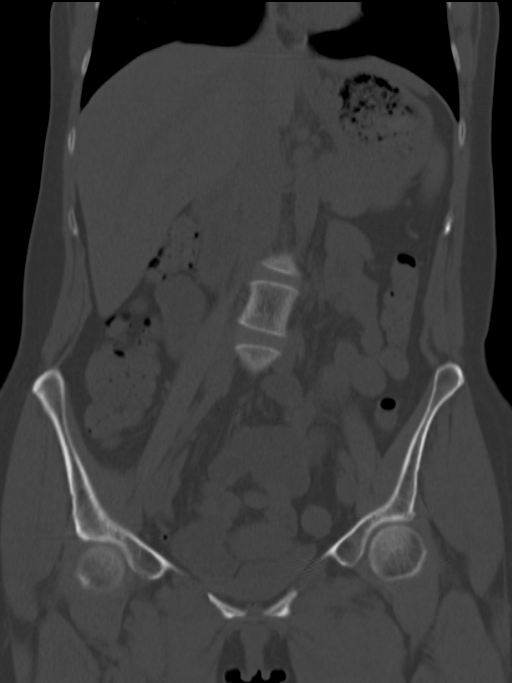
[im 38/69  soft-tissue]
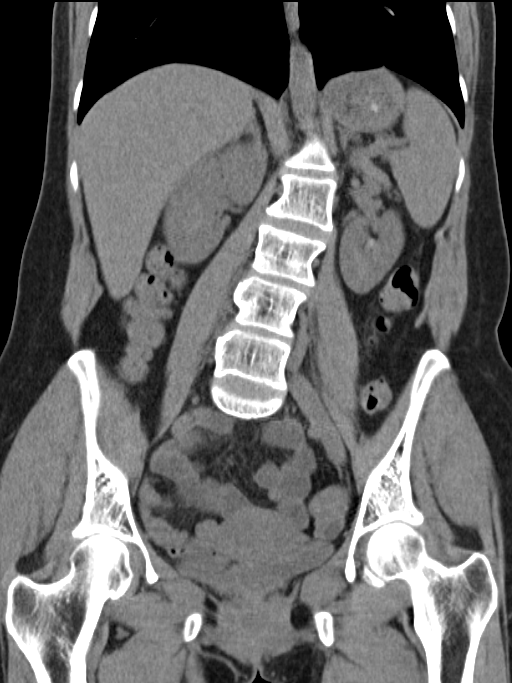

[Series 5: mpr sagittal (id) · sagittal · 0.45mm/px · 1 of 100 slices shown, 2 images]
[im 34/100  soft-tissue]
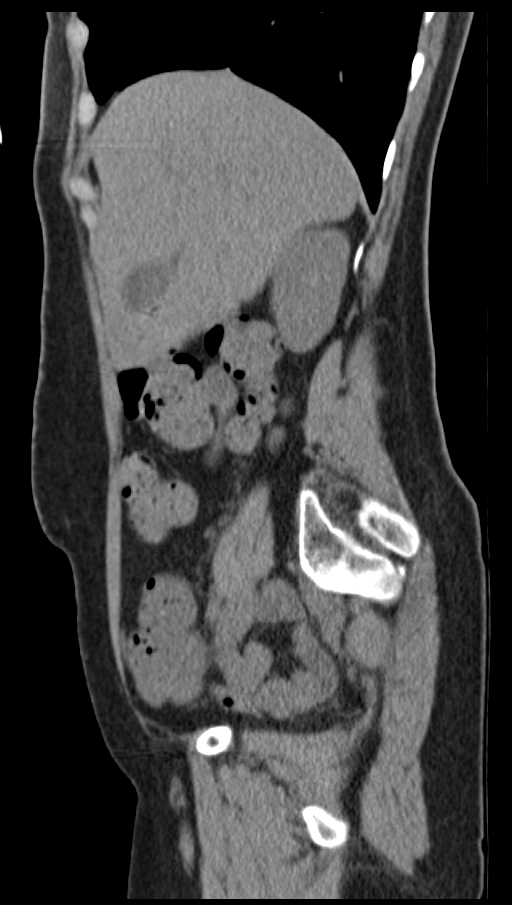
[im 34/100  bone]
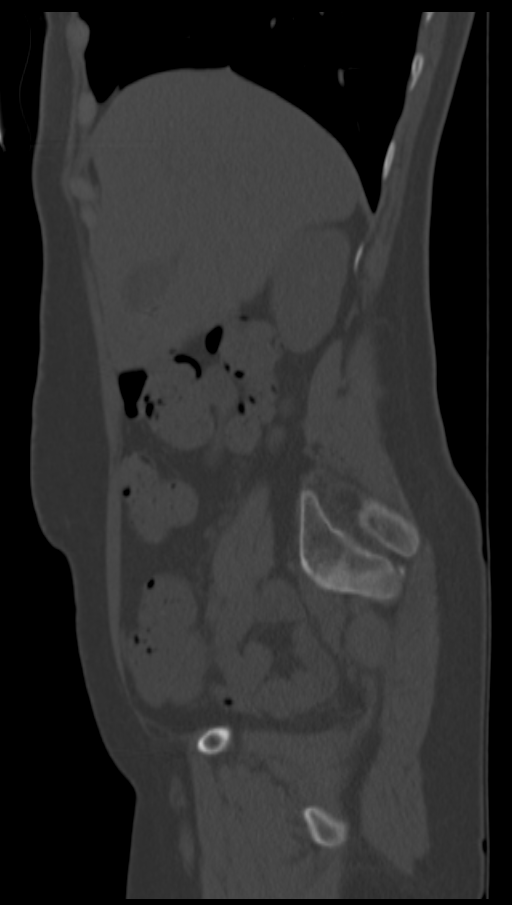

[8 of 46 positions shown; findings below may reference images not displayed]

FINDINGS: Lung bases are clear.  Non-IV contrast images demonstrate
no focal hepatic lesion.  The gallbladder, pancreas, spleen, and
adrenal glands are normal.

The there are two nonobstructing calculi within the upper pole of
the left kidney measuring 4 mm and 3 mm respectively.  There are
two  2 mm calculi within the right kidney which are not
obstructing.  The medullary pyramids are high density which is
similar to prior.  No evidence of ureterolithiasis or obstructive
uropathy.

The stomach, small bowel, cecum, appendix are normal.  The colon
rectosigmoid colon are normal.

Abdominal aorta is normal caliber.  No retroperitoneal
lymphadenopathy.

No free fluid the pelvis.  No evidence of bladder stones distal
renal stones.  Multiple rounded vascular calcification lower pelvis
are unchanged from prior.

The uterus and adnexa appear normal.  No evidence of pelvic
lymphadenopathy. Review of  bone windows demonstrates no aggressive
osseous lesions. There is scoliosis of the lumbar spine.
IMPRESSION: 1.  Bilateral nonobstructive nephrolithiasis. The calculi have
increased slightly in size compared to prior.
2.  No evidence of obstructive uropathy.

## 2014-02-27 ENCOUNTER — Telehealth: Payer: Self-pay | Admitting: Family Medicine

## 2014-02-27 NOTE — Telephone Encounter (Signed)
ACCORDING TO BENCHMARK- PT WAS DISMISSED FROM OUR PRACTICE.  (UNDER LAST NAME OF INMAN)APPT MADE

## 2014-03-04 ENCOUNTER — Encounter: Payer: Self-pay | Admitting: General Practice

## 2014-03-04 ENCOUNTER — Ambulatory Visit (INDEPENDENT_AMBULATORY_CARE_PROVIDER_SITE_OTHER): Payer: Medicare Other | Admitting: General Practice

## 2014-03-04 ENCOUNTER — Encounter (INDEPENDENT_AMBULATORY_CARE_PROVIDER_SITE_OTHER): Payer: Self-pay

## 2014-03-04 VITALS — BP 119/84 | HR 103 | Temp 98.1°F | Ht 60.5 in | Wt 91.8 lb

## 2014-03-04 DIAGNOSIS — G8929 Other chronic pain: Secondary | ICD-10-CM

## 2014-03-04 DIAGNOSIS — F411 Generalized anxiety disorder: Secondary | ICD-10-CM

## 2014-03-04 DIAGNOSIS — M549 Dorsalgia, unspecified: Secondary | ICD-10-CM

## 2014-03-04 NOTE — Progress Notes (Signed)
   Subjective:    Patient ID: Margaret Stewart, female    DOB: 11-02-1976, 38 y.o.   MRN: [id number]  HPI Patient presents today to establish care. [place]. Reports a history IBS, COPD, asthma, gerd, chronic back pain, and anxiety. Reports taking medications as directed. Pulmonologist is managing lung disease. Reports she would like to have pain medication and anxiety medication decreased.  Reports being seen by pain management in the past, but discontinued by patient.     Review of Systems  Constitutional: Negative for fever and chills.  Respiratory: Positive for shortness of breath. Negative for chest tightness and wheezing.        Chronic shortness of breath due to lung issues  Cardiovascular: Negative for chest pain and palpitations.  Neurological: Negative for dizziness, weakness and headaches.  Psychiatric/Behavioral: Negative for suicidal ideas and self-injury. The patient is not nervous/anxious.        Objective:   Physical Exam  Constitutional: She is oriented to person, place, and time. She appears well-developed.  Cardiovascular: Regular rhythm and normal heart sounds.   No murmur heard. Pulmonary/Chest: She exhibits no tenderness.  Decreased breath sounds throughout.   Abdominal: Soft. Bowel sounds are normal. She exhibits no distension. There is no tenderness.  Neurological: She is alert and oriented to person, place, and time.  Skin: Skin is warm and dry.  Psychiatric: She has a normal mood and affect.          Assessment & Plan:  1. Generalized anxiety disorder   2. Chronic back pain  - Ambulatory referral to Pain Clinic -discussed with patient that old medical records need to be sent to office. -discussed that chronic pain medication and anxiety will not be managed at this office, will make referral to pain management clinic -informed patient she should continue to get chronic pain  medication and anti anxiety medication from previous provider -may seek emergency medical treatment -will help manage ibs and gerd -list of counseling centers provided Patient verbalized understanding Coralie KeensMae E. Melonee Gerstel, FNP-C

## 2014-03-04 NOTE — Patient Instructions (Addendum)
Health Maintenance, Female A healthy lifestyle and preventative care can promote health and wellness.  Maintain regular health, dental, and eye exams.  Eat a healthy diet. Foods like vegetables, fruits, whole grains, low-fat dairy products, and lean protein foods contain the nutrients you need without too many calories. Decrease your intake of foods high in solid fats, added sugars, and salt. Get information about a proper diet from your caregiver, if necessary.  Regular physical exercise is one of the most important things you can do for your health. Most adults should get at least 150 minutes of moderate-intensity exercise (any activity that increases your heart rate and causes you to sweat) each week. In addition, most adults need muscle-strengthening exercises on 2 or more days a week.   Maintain a healthy weight. The body mass index (BMI) is a screening tool to identify possible weight problems. It provides an estimate of body fat based on height and weight. Your caregiver can help determine your BMI, and can help you achieve or maintain a healthy weight. For adults 20 years and older:  A BMI below 18.5 is considered underweight.  A BMI of 18.5 to 24.9 is normal.  A BMI of 25 to 29.9 is considered overweight.  A BMI of 30 and above is considered obese.  Maintain normal blood lipids and cholesterol by exercising and minimizing your intake of saturated fat. Eat a balanced diet with plenty of fruits and vegetables. Blood tests for lipids and cholesterol should begin at age 20 and be repeated every 5 years. If your lipid or cholesterol levels are high, you are over 50, or you are a high risk for heart disease, you may need your cholesterol levels checked more frequently.Ongoing high lipid and cholesterol levels should be treated with medicines if diet and exercise are not effective.  If you smoke, find out from your caregiver how to quit. If you do not use tobacco, do not start.  Lung  cancer screening is recommended for adults aged 55 80 years who are at high risk for developing lung cancer because of a history of smoking. Yearly low-dose computed tomography (CT) is recommended for people who have at least a 30-pack-year history of smoking and are a current smoker or have quit within the past 15 years. A pack year of smoking is smoking an average of 1 pack of cigarettes a day for 1 year (for example: 1 pack a day for 30 years or 2 packs a day for 15 years). Yearly screening should continue until the smoker has stopped smoking for at least 15 years. Yearly screening should also be stopped for people who develop a health problem that would prevent them from having lung cancer treatment.  If you are pregnant, do not drink alcohol. If you are breastfeeding, be very cautious about drinking alcohol. If you are not pregnant and choose to drink alcohol, do not exceed 1 drink per day. One drink is considered to be 12 ounces (355 mL) of beer, 5 ounces (148 mL) of wine, or 1.5 ounces (44 mL) of liquor.  Avoid use of street drugs. Do not share needles with anyone. Ask for help if you need support or instructions about stopping the use of drugs.  High blood pressure causes heart disease and increases the risk of stroke. Blood pressure should be checked at least every 1 to 2 years. Ongoing high blood pressure should be treated with medicines, if weight loss and exercise are not effective.  If you are 55 to   38 years old, ask your caregiver if you should take aspirin to prevent strokes.  Diabetes screening involves taking a blood sample to check your fasting blood sugar level. This should be done once every 3 years, after age 45, if you are within normal weight and without risk factors for diabetes. Testing should be considered at a younger age or be carried out more frequently if you are overweight and have at least 1 risk factor for diabetes.  Breast cancer screening is essential preventative care  for women. You should practice "breast self-awareness." This means understanding the normal appearance and feel of your breasts and may include breast self-examination. Any changes detected, no matter how small, should be reported to a caregiver. Women in their 20s and 30s should have a clinical breast exam (CBE) by a caregiver as part of a regular health exam every 1 to 3 years. After age 40, women should have a CBE every year. Starting at age 40, women should consider having a mammogram (breast X-ray) every year. Women who have a family history of breast cancer should talk to their caregiver about genetic screening. Women at a high risk of breast cancer should talk to their caregiver about having an MRI and a mammogram every year.  Breast cancer gene (BRCA)-related cancer risk assessment is recommended for women who have family members with BRCA-related cancers. BRCA-related cancers include breast, ovarian, tubal, and peritoneal cancers. Having family members with these cancers may be associated with an increased risk for harmful changes (mutations) in the breast cancer genes BRCA1 and BRCA2. Results of the assessment will determine the need for genetic counseling and BRCA1 and BRCA2 testing.  The Pap test is a screening test for cervical cancer. Women should have a Pap test starting at age 21. Between ages 21 and 29, Pap tests should be repeated every 2 years. Beginning at age 30, you should have a Pap test every 3 years as long as the past 3 Pap tests have been normal. If you had a hysterectomy for a problem that was not cancer or a condition that could lead to cancer, then you no longer need Pap tests. If you are between ages 65 and 70, and you have had normal Pap tests going back 10 years, you no longer need Pap tests. If you have had past treatment for cervical cancer or a condition that could lead to cancer, you need Pap tests and screening for cancer for at least 20 years after your treatment. If Pap  tests have been discontinued, risk factors (such as a new sexual partner) need to be reassessed to determine if screening should be resumed. Some women have medical problems that increase the chance of getting cervical cancer. In these cases, your caregiver may recommend more frequent screening and Pap tests.  The human papillomavirus (HPV) test is an additional test that may be used for cervical cancer screening. The HPV test looks for the virus that can cause the cell changes on the cervix. The cells collected during the Pap test can be tested for HPV. The HPV test could be used to screen women aged 30 years and older, and should be used in women of any age who have unclear Pap test results. After the age of 30, women should have HPV testing at the same frequency as a Pap test.  Colorectal cancer can be detected and often prevented. Most routine colorectal cancer screening begins at the age of 50 and continues through age 75. However, your caregiver   may recommend screening at an earlier age if you have risk factors for colon cancer. On a yearly basis, your caregiver may provide home test kits to check for hidden blood in the stool. Use of a small camera at the end of a tube, to directly examine the colon (sigmoidoscopy or colonoscopy), can detect the earliest forms of colorectal cancer. Talk to your caregiver about this at age 97, when routine screening begins. Direct examination of the colon should be repeated every 5 to 10 years through age 58, unless early forms of pre-cancerous polyps or small growths are found.  Hepatitis C blood testing is recommended for all people born from [birth year] through 1965 and any individual with known risks for hepatitis C.  Practice safe sex. Use condoms and avoid high-risk sexual practices to reduce the spread of sexually transmitted infections (STIs). Sexually active women aged 66 and younger should be checked for Chlamydia, which is a common sexually transmitted infection.  Older women with new or multiple partners should also be tested for Chlamydia. Testing for other STIs is recommended if you are sexually active and at increased risk.  Osteoporosis is a disease in which the bones lose minerals and strength with aging. This can result in serious bone fractures. The risk of osteoporosis can be identified using a bone density scan. Women ages 68 and over and women at risk for fractures or osteoporosis should discuss screening with their caregivers. Ask your caregiver whether you should be taking a calcium supplement or vitamin D to reduce the rate of osteoporosis.  Menopause can be associated with physical symptoms and risks. Hormone replacement therapy is available to decrease symptoms and risks. You should talk to your caregiver about whether hormone replacement therapy is right for you.  Use sunscreen. Apply sunscreen liberally and repeatedly throughout the day. You should seek shade when your shadow is shorter than you. Protect yourself by wearing long sleeves, pants, a wide-brimmed hat, and sunglasses year round, whenever you are outdoors.  Notify your caregiver of new moles or changes in moles, especially if there is a change in shape or color. Also notify your caregiver if a mole is larger than the size of a pencil eraser.  Stay current with your immunizations. Document Released: 06/12/2011 Document Revised: 03/24/2013 Document Reviewed: 06/12/2011 [facility] Patient Information 2014 [place].  [facility] [address] [place] ,[place] [address] [phone] Children limited to anxiety and depression- NO ADD/ADHD Does not accept Medicaid  [facility] [address] [place], [place] Does see children Does accept medicaid Will assess for Autism but not treat  [facility] [place]. [address] [place] [phone] Does see children  Does accept Medicaid Medication  management- substance abuse- bipolar- grief- family-marriage- OCD- Anxiety- PTSD  [facility] Does see children Does accept medicaid They do perform psychological testing  [place] [address] [place] Schedule through [place]. [phone] Patient must call and make own appointment Does se children Does accept Medicaid  [facility] [place], [place] 7-10 accompanied by an adult, 11 and up by themselves Does accept Medicaid Will see patients with- substance abuse-ADHD-ADD-Bipolar-Domestic violence-Marriage counseling- Family Counseling and sexual abuse  [facility] Psych[facility]rist [address], [place] [phone] Does see children Does accept [facility]  [facility] [address] [place] [phone]  Dr. Sabra Heck-  Psychiatrist [address] [place], [place] Specializes in ADHD and addictions They do ADHD testing Suboxone clinic  [facility] [address] [place] [phone] Does Child psychological testing  [facility] [address] Dellia [place] [phone] Does Accept Medicaid Evaluates for Autism  [facility] [place] [phone] Does Not accept Medicaid Does do adult ADD evaluations  Dr. Lorenza Evangelist [address] [place] [phone] Does not Take Medicaid Sees ADD and ADHD for treatment      [facility] [address] [place], [place] [address] [phone] Takes Medicaid WIll see children as young as 88

## 2020-07-11 DEATH — deceased
